# Patient Record
Sex: Male | Born: 1960 | Race: Black or African American | Hispanic: No | Marital: Married | Smoking: Former smoker
Health system: Southern US, Community
[De-identification: ages and names within clinical notes are randomized; demographics above are authoritative.]

## PROBLEM LIST (undated history)

## (undated) DIAGNOSIS — E785 Hyperlipidemia, unspecified: Secondary | ICD-10-CM

## (undated) DIAGNOSIS — I10 Essential (primary) hypertension: Secondary | ICD-10-CM

## (undated) HISTORY — DX: Essential (primary) hypertension: I10

## (undated) HISTORY — DX: Hyperlipidemia, unspecified: E78.5

## (undated) HISTORY — PX: ABSCESS DRAINAGE: SHX1119

---

## 2004-01-29 ENCOUNTER — Emergency Department (HOSPITAL_COMMUNITY): Admission: EM | Admit: 2004-01-29 | Discharge: 2004-01-29 | Payer: Self-pay | Admitting: Emergency Medicine

## 2006-01-04 IMAGING — CR DG HAND COMPLETE 3+V*L*
3 series · 3 of 3 positions shown · non-contrast
Comparison: none

CLINICAL DATA: Laceration.  
 LEFT HAND - 3 VIEWS:
 A bandage is in place over the radial aspect of the wrist.  No radiopaque foreign body or underlying acute bony or joint abnormality is identified.

[view not recorded (1 of 3)]
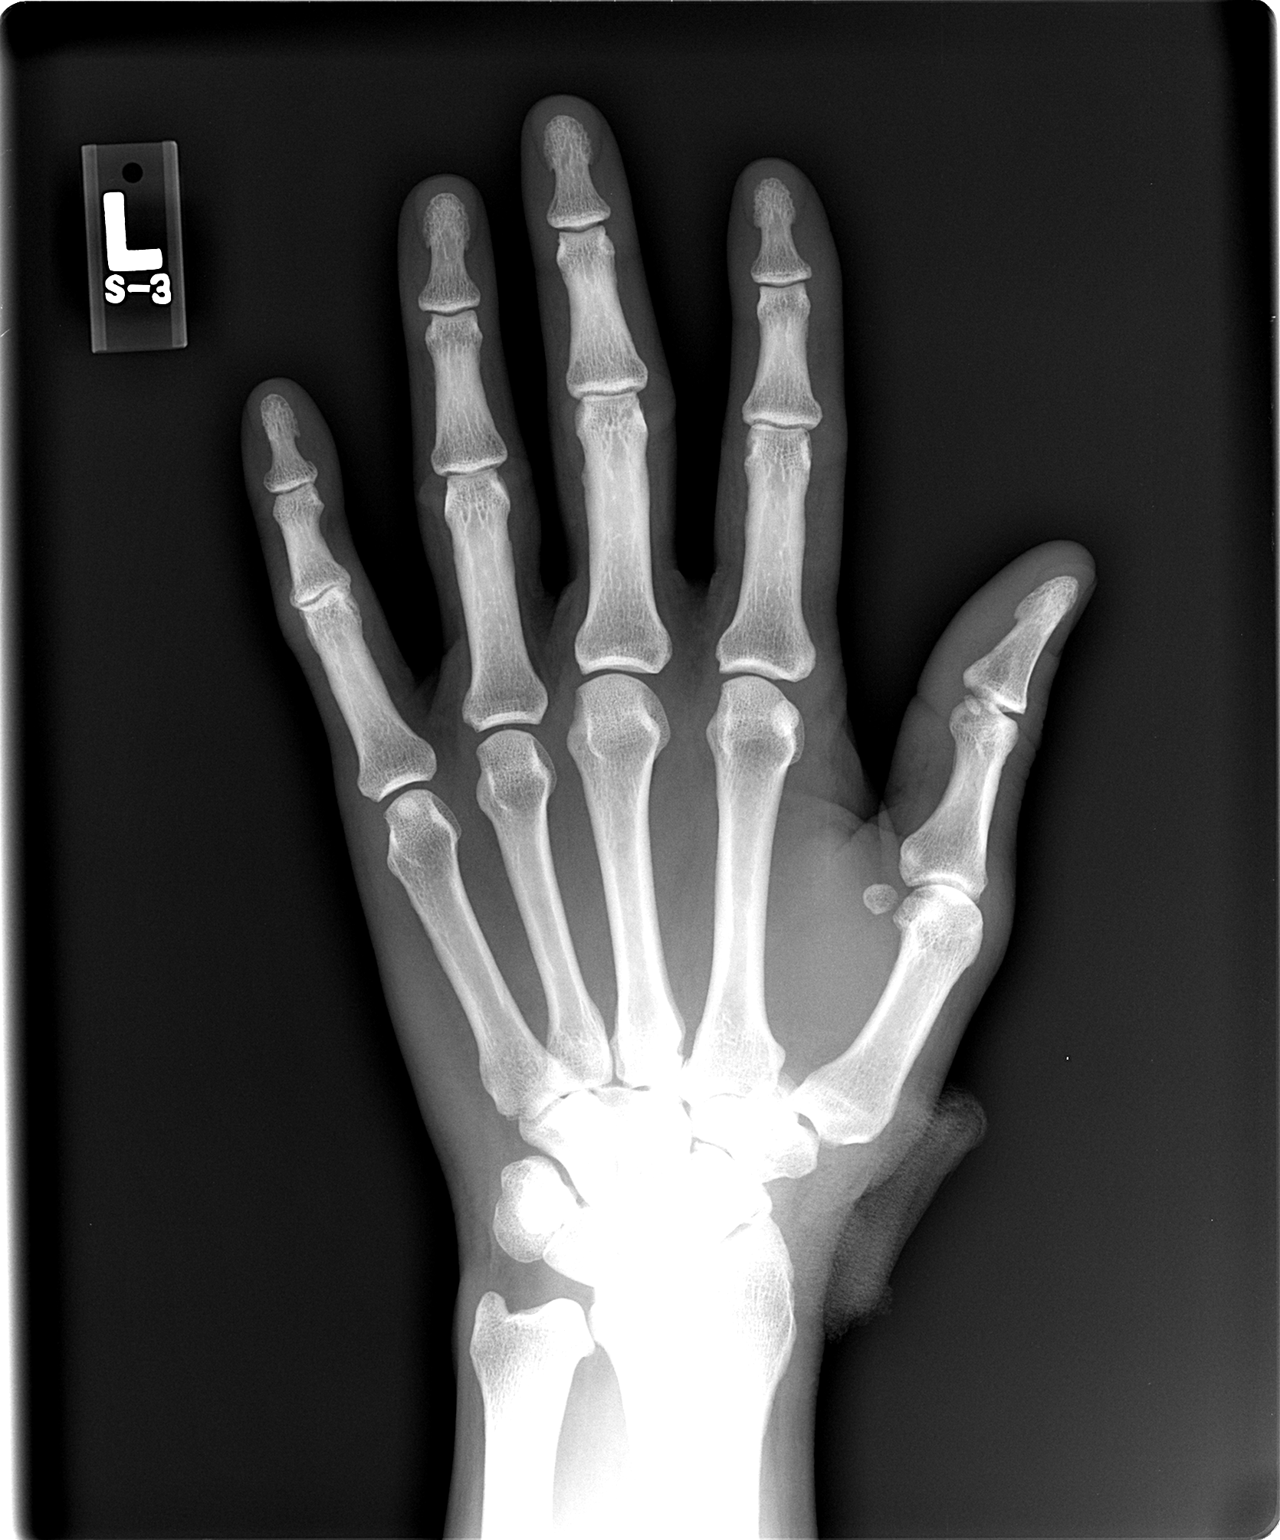

[view not recorded (2 of 3)]
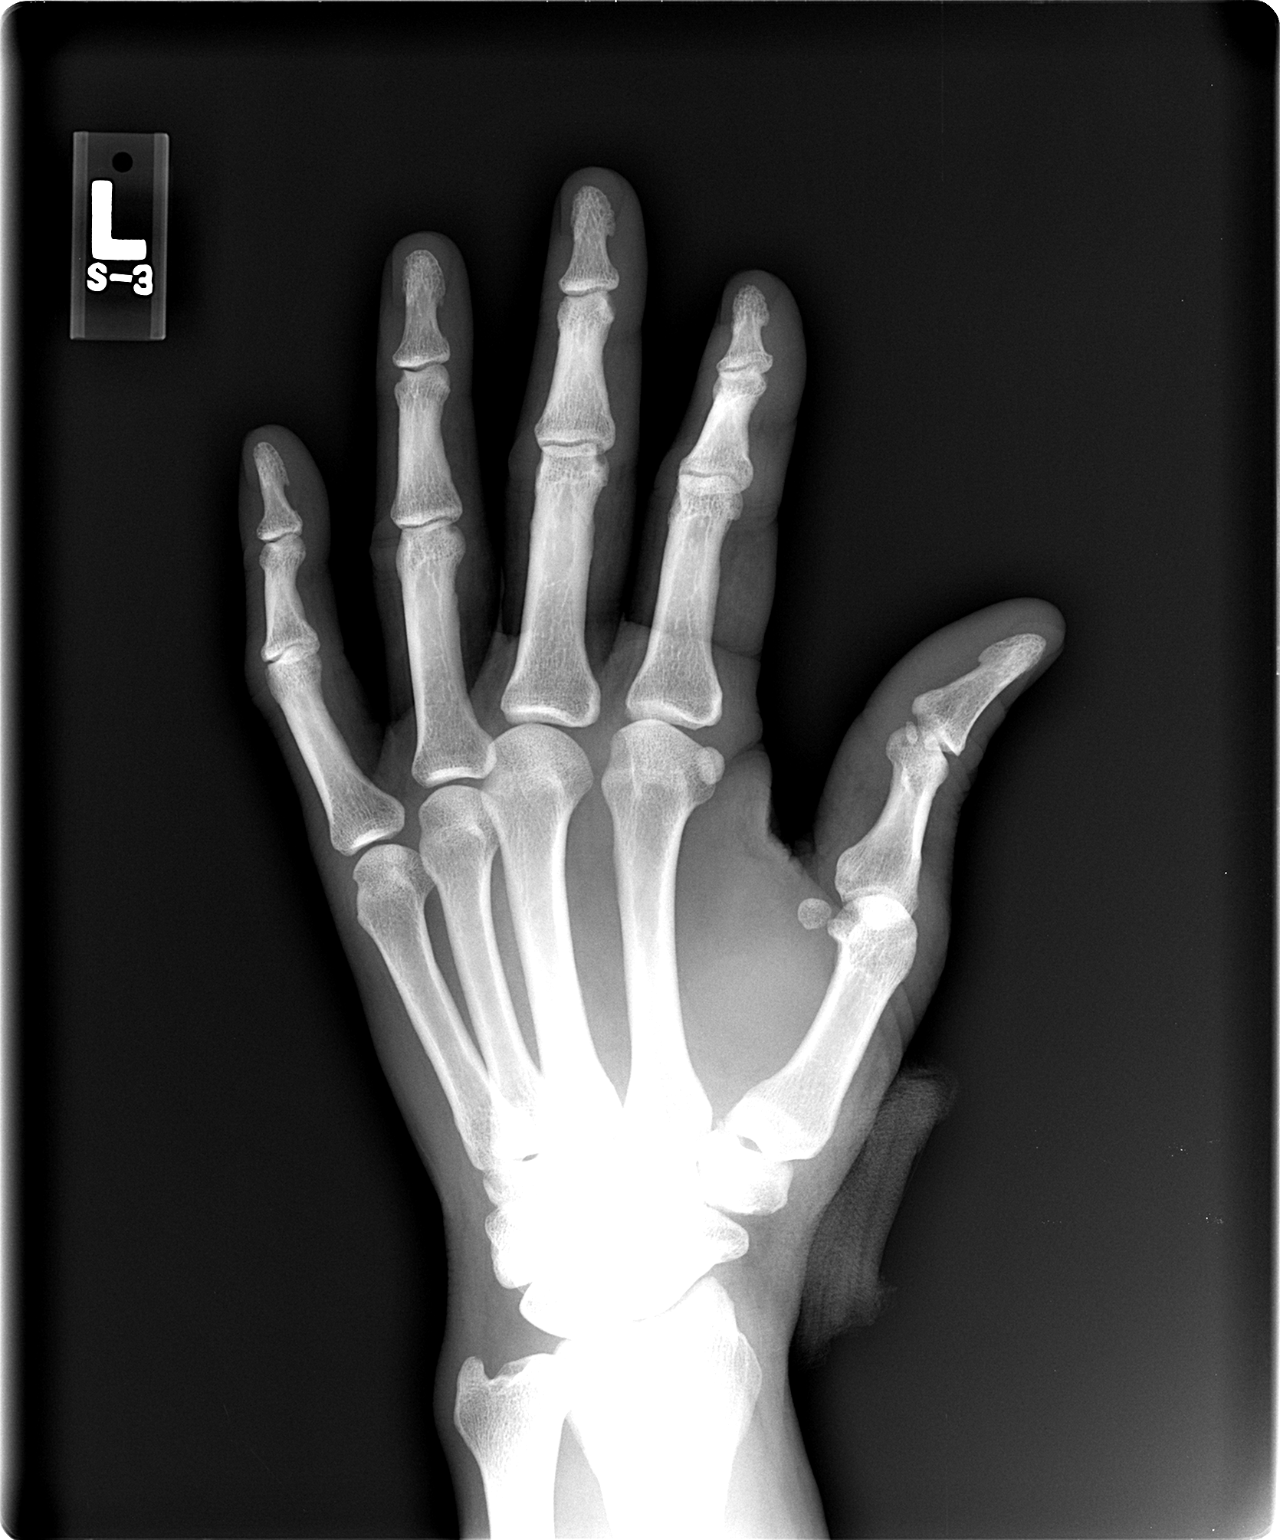

[view not recorded (3 of 3)]
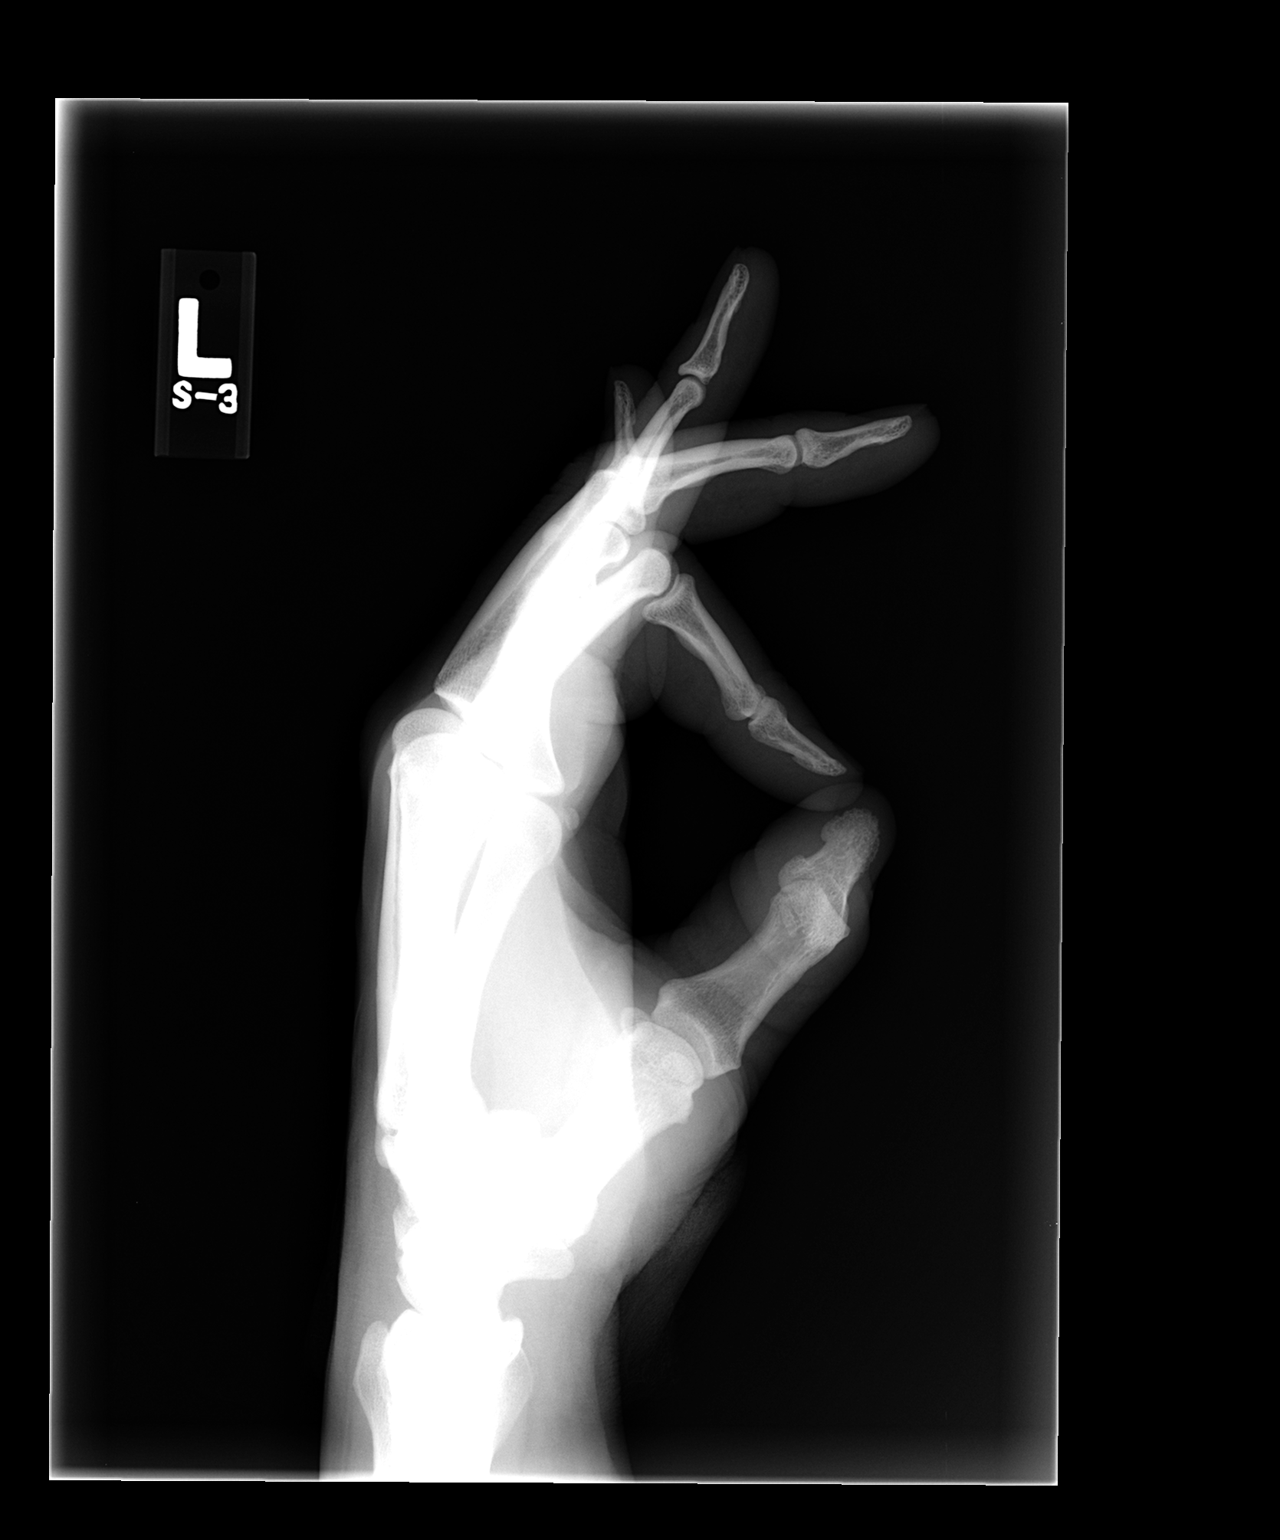

[3 of 3 positions shown; findings below may reference images not displayed]

IMPRESSION: Laceration without radiopaque foreign body.

## 2009-02-22 ENCOUNTER — Emergency Department (HOSPITAL_COMMUNITY): Admission: EM | Admit: 2009-02-22 | Discharge: 2009-02-22 | Payer: Self-pay | Admitting: Emergency Medicine

## 2009-04-29 ENCOUNTER — Encounter: Admission: RE | Admit: 2009-04-29 | Discharge: 2009-05-26 | Payer: Self-pay | Admitting: Orthopaedic Surgery

## 2013-03-10 ENCOUNTER — Ambulatory Visit (INDEPENDENT_AMBULATORY_CARE_PROVIDER_SITE_OTHER): Payer: BC Managed Care – PPO | Admitting: Surgery

## 2013-03-10 ENCOUNTER — Encounter (INDEPENDENT_AMBULATORY_CARE_PROVIDER_SITE_OTHER): Payer: Self-pay | Admitting: Surgery

## 2013-03-10 ENCOUNTER — Encounter (INDEPENDENT_AMBULATORY_CARE_PROVIDER_SITE_OTHER): Payer: Self-pay

## 2013-03-10 VITALS — BP 128/72 | HR 72 | Temp 98.8°F | Resp 14 | Ht 76.0 in | Wt 261.2 lb

## 2013-03-10 DIAGNOSIS — K611 Rectal abscess: Secondary | ICD-10-CM

## 2013-03-10 DIAGNOSIS — K612 Anorectal abscess: Secondary | ICD-10-CM

## 2013-03-10 MED ORDER — AMOXICILLIN-POT CLAVULANATE 875-125 MG PO TABS: 1.0000 | ORAL_TABLET | Freq: Two times a day (BID) | ORAL | Status: AC

## 2013-03-10 NOTE — Progress Notes (Signed)
Subjective:     Patient ID: Carl Blevins, male   DOB: [birth year], 53 y.o.   MRN: [id number]  HPI This is a gentleman who has had perirectal infections in the past. He has had multiple incision and drainages in the same spot. He reports discomfort in the same area the start of this weekend.  Review of Systems     Objective:   Physical Exam On examination, a left perianal area there appears to be an abscess. I anesthetized the area of lidocaine after prepping with Betadine. I made an incision with a scalpel and drained a small abscess cavity.    Assessment:     Perirectal abscess     Plan:     Wound care instructions were given. I will start him on antibiotics. I will see him back next week

## 2013-03-17 ENCOUNTER — Telehealth (INDEPENDENT_AMBULATORY_CARE_PROVIDER_SITE_OTHER): Payer: Self-pay

## 2013-03-17 NOTE — Telephone Encounter (Signed)
Pt called stating he was offered pain med last week at [facility] but declined. He now thinks he would like something for pain. Pt states area is becoming more painful even thought the swelling seems to be improving. Pt advised that he would have to come to office to pick up written rx if MD give order. Pt states he has appt tomorrow to see MD and will wait till then. Pt does not want to make 2 trips to office. Pt to increase tub soaks and use soft pillow for sitting.

## 2013-03-18 ENCOUNTER — Ambulatory Visit (INDEPENDENT_AMBULATORY_CARE_PROVIDER_SITE_OTHER): Payer: BC Managed Care – PPO | Admitting: Surgery

## 2013-03-18 ENCOUNTER — Encounter (INDEPENDENT_AMBULATORY_CARE_PROVIDER_SITE_OTHER): Payer: Self-pay | Admitting: Surgery

## 2013-03-18 VITALS — BP 130/80 | HR 82 | Temp 98.1°F | Ht 76.0 in | Wt 256.0 lb

## 2013-03-18 DIAGNOSIS — Z09 Encounter for follow-up examination after completed treatment for conditions other than malignant neoplasm: Secondary | ICD-10-CM

## 2013-03-18 MED ORDER — HYDROCODONE-ACETAMINOPHEN 5-325 MG PO TABS: 1.0000 | ORAL_TABLET | ORAL | Status: AC | PRN

## 2013-03-18 NOTE — Progress Notes (Signed)
Subjective:     Patient ID: Carl Blevins, male   DOB: 10/05/1960, 53 y.o.   MRN: [id number]  HPI He is here for his first visit status post incision and drainage of a perirectal abscess last week. He reports he had increasing drainage over the weekend and significant pain is now better. He remains on antibiotics  Review of Systems     Objective:   Physical Exam On exam, the wound is clean and induration is less    Assessment:     Patient stable     Plan:     He will finish the antibiotics. I will write him for some hydrocodone. I will see him back in approximately 3 weeks

## 2013-04-07 ENCOUNTER — Encounter (INDEPENDENT_AMBULATORY_CARE_PROVIDER_SITE_OTHER): Payer: BC Managed Care – PPO | Admitting: Surgery

## 2013-04-15 ENCOUNTER — Ambulatory Visit (INDEPENDENT_AMBULATORY_CARE_PROVIDER_SITE_OTHER): Payer: BC Managed Care – PPO | Admitting: Surgery

## 2013-04-15 ENCOUNTER — Encounter (INDEPENDENT_AMBULATORY_CARE_PROVIDER_SITE_OTHER): Payer: Self-pay | Admitting: Surgery

## 2013-04-15 VITALS — BP 132/76 | HR 80 | Temp 97.6°F | Resp 16 | Ht 76.0 in | Wt 246.2 lb

## 2013-04-15 DIAGNOSIS — K611 Rectal abscess: Secondary | ICD-10-CM

## 2013-04-15 DIAGNOSIS — K612 Anorectal abscess: Secondary | ICD-10-CM

## 2013-04-15 NOTE — Progress Notes (Signed)
Subjective:     Patient ID: Carl Blevins, male   DOB: October 15, 1960, 53 y.o.   MRN: [id number]  HPI He is here for a final wound check. He has no pain and no drainage  Review of Systems     Objective:   Physical Exam    The perirectal abscess site has completely healed Assessment:     Patient with healed perirectal abscess     Plan:     We will see him back as needed

## 2020-12-15 LAB — EXTERNAL GENERIC LAB PROCEDURE: COLOGUARD: NEGATIVE
